# Patient Record
Sex: Male | Born: 2000 | Race: Black or African American | Hispanic: No | Marital: Single | Smoking: Never smoker
Health system: Southern US, Community
[De-identification: ages and names within clinical notes are randomized; demographics above are authoritative.]

## PROBLEM LIST (undated history)

## (undated) HISTORY — PX: HERNIA REPAIR: SHX51

---

## 2019-09-15 ENCOUNTER — Emergency Department (HOSPITAL_COMMUNITY): Payer: No Typology Code available for payment source

## 2019-09-15 ENCOUNTER — Other Ambulatory Visit: Payer: Self-pay

## 2019-09-15 ENCOUNTER — Encounter (HOSPITAL_COMMUNITY): Payer: Self-pay | Admitting: *Deleted

## 2019-09-15 ENCOUNTER — Emergency Department (HOSPITAL_COMMUNITY)
Admission: EM | Admit: 2019-09-15 | Discharge: 2019-09-15 | Disposition: A | Discharge status: Home / Self Care | Payer: No Typology Code available for payment source | Attending: Emergency Medicine | Admitting: Emergency Medicine

## 2019-09-15 DIAGNOSIS — G8929 Other chronic pain: Secondary | ICD-10-CM | POA: Diagnosis not present

## 2019-09-15 DIAGNOSIS — M25571 Pain in right ankle and joints of right foot: Secondary | ICD-10-CM | POA: Diagnosis present

## 2019-09-15 MED ORDER — NAPROXEN 500 MG PO TABS: 500.0000 mg | ORAL_TABLET | Freq: Two times a day (BID) | ORAL | 0 refills | Status: AC | PRN

## 2019-09-15 NOTE — Discharge Instructions (Signed)
Wear the ankle brace as prescribed and follow-up with ankle specialist.  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 This can sometimes cause chronic ankle problems.  Return to the ED with new or worsening symptoms.

## 2019-09-15 NOTE — ED Triage Notes (Signed)
Pt states he has been having pain in his rt ankle for a while. Getting worse. Has been to PT and was told it was tendonitis. ambulated to triage room

## 2019-09-15 NOTE — ED Notes (Signed)
Ortho called 

## 2019-09-15 NOTE — ED Provider Notes (Signed)
[facility]-EMERGENCY DEPT Provider Note   CSN: [id number] Arrival date & time: 09/15/19  1430     History Chief Complaint  Patient presents with  . Ankle Pain    Rt    Victor Pierce is a 18 y.o. male.  Patient here with right ankle pain which he states is been bothering him for quite some time.  He believes since about 2017.  He did not have any fall or trauma.  He has pain in his ankle which is worse when he stands for prolonged period of time.  He was seen by physical therapist who told him he had tendinitis but this was several months ago.  He does not take anything for it.  He is here because his mother wants him to have an x-ray.  Denies any other joint pain.  No fever or vomiting.  Pain is across his lateral right ankle and is not there when he is not standing on it.  Denies any fever, redness, swelling.  The history is provided by the patient.  Ankle Pain      History reviewed. No pertinent past medical history.  There are no problems to display for this patient.   Past Surgical History:  Procedure Laterality Date  . HERNIA REPAIR         No family history on file.  Social History   Tobacco Use  . Smoking status: Never Smoker  . Smokeless tobacco: Never Used  Substance Use Topics  . Alcohol use: Never  . Drug use: Never    Home Medications Prior to Admission medications   Not on File    Allergies    Patient has no known allergies.  Review of Systems   Review of Systems  Constitutional: Negative for activity change and appetite change.  HENT: Negative for congestion and rhinorrhea.   Respiratory: Negative for chest tightness.   Cardiovascular: Negative for chest pain.  Gastrointestinal: Negative for abdominal pain, nausea and vomiting.  Genitourinary: Negative for dysuria and hematuria.  Musculoskeletal: Positive for arthralgias and myalgias.  Skin: Negative for rash.  Neurological: Negative for dizziness,  weakness and headaches.    all other systems are negative except as noted in the HPI and PMH.   Physical Exam Updated Vital Signs BP 126/85 (BP Location: Left Arm)   Pulse 66   Temp 98.3 F (36.8 C) (Oral)   Resp 15   Ht 5\' 7"  (1.702 m)   Wt 65.8 kg   SpO2 98%   BMI 22.71 kg/m   Physical Exam Vitals and nursing note reviewed.  Constitutional:      General: He is not in acute distress.    Appearance: He is well-developed.  HENT:     Head: Normocephalic and atraumatic.     Mouth/Throat:     Pharynx: No oropharyngeal exudate.  Eyes:     Conjunctiva/sclera: Conjunctivae normal.     Pupils: Pupils are equal, round, and reactive to light.  Neck:     Comments: No meningismus. Cardiovascular:     Rate and Rhythm: Normal rate and regular rhythm.     Heart sounds: Normal heart sounds. No murmur.  Pulmonary:     Effort: Pulmonary effort is normal. No respiratory distress.     Breath sounds: Normal breath sounds.  Abdominal:     Palpations: Abdomen is soft.     Tenderness: There is no abdominal tenderness. There is no guarding or rebound.  Musculoskeletal:  General: Tenderness present. No swelling, deformity or signs of injury. Normal range of motion.     Cervical back: Normal range of motion and neck supple.     Comments: Right ankle appears normal to inspection.  There is tenderness on the lateral side without edema or erythema. Intact DP and PT pulse.  Achilles tendon intact.  No pain at base of fifth metatarsal, no proximal fibular pain   Skin:    General: Skin is warm.  Neurological:     Mental Status: He is alert and oriented to person, place, and time.     Cranial Nerves: No cranial nerve deficit.     Motor: No abnormal muscle tone.     Coordination: Coordination normal.     Comments: No ataxia on finger to nose bilaterally. No pronator drift. 5/5 strength throughout. CN 2-12 intact.Equal grip strength. Sensation intact.   Psychiatric:        Behavior: Behavior  normal.     ED Results / Procedures / Treatments   Labs (all labs ordered are listed, but only abnormal results are displayed) Labs Reviewed - No data to display  EKG None  Radiology DG Ankle Complete Right  Result Date: 09/15/2019 CLINICAL DATA:  18 year old male with right ankle pain. EXAM: RIGHT ANKLE - COMPLETE 3+ VIEW COMPARISON:  None. FINDINGS: There is no acute fracture or dislocation. The bones are well mineralized. No arthritic changes. The ankle mortise is intact. 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 CT may provide better evaluation. The soft tissues are unremarkable. IMPRESSION: 1. No acute fracture or dislocation. 2. Findings suggestive of talocalcaneal coalition. Electronically Signed   By: Anner Crete M.D.   On: 09/15/2019 16:28    Procedures Procedures (including critical care time)  Medications Ordered in ED Medications - No data to display  ED Course  I have reviewed the triage vital signs and the nursing notes.  Pertinent labs & imaging results that were available during my care of the patient were reviewed by me and considered in my medical decision making (see chart for details).    MDM Rules/Calculators/A&P                     Chronic ankle pain with request for x-ray. Ankle is neurovascularly intact.  Discussed that x-ray would likely be low yield but patient wishes to proceed.  X-ray shows no fracture or dislocation.  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  Patient will be given an ASO, anti-inflammatories, referral to ankle specialist for likely MRI and follow-up. Return precautions discussed. Final Clinical Impression(s) / ED Diagnoses Final diagnoses:  Chronic pain of right ankle    Rx / DC Orders ED Discharge Orders    None       Erol Flanagin, Annie Main, MD 09/15/19 1646

## 2019-10-07 DIAGNOSIS — M2141 Flat foot [pes planus] (acquired), right foot: Secondary | ICD-10-CM | POA: Diagnosis not present

## 2019-10-07 DIAGNOSIS — M25871 Other specified joint disorders, right ankle and foot: Secondary | ICD-10-CM | POA: Diagnosis not present

## 2019-11-04 DIAGNOSIS — Z13228 Encounter for screening for other metabolic disorders: Secondary | ICD-10-CM | POA: Diagnosis not present

## 2019-11-04 DIAGNOSIS — Z0000 Encounter for general adult medical examination without abnormal findings: Secondary | ICD-10-CM | POA: Diagnosis not present

## 2019-11-18 DIAGNOSIS — M2141 Flat foot [pes planus] (acquired), right foot: Secondary | ICD-10-CM | POA: Diagnosis not present

## 2019-12-16 DIAGNOSIS — M2141 Flat foot [pes planus] (acquired), right foot: Secondary | ICD-10-CM | POA: Diagnosis not present

## 2020-02-02 DIAGNOSIS — Q666 Other congenital valgus deformities of feet: Secondary | ICD-10-CM | POA: Diagnosis not present

## 2020-02-02 DIAGNOSIS — M25571 Pain in right ankle and joints of right foot: Secondary | ICD-10-CM | POA: Diagnosis not present

## 2020-02-02 DIAGNOSIS — M7670 Peroneal tendinitis, unspecified leg: Secondary | ICD-10-CM | POA: Diagnosis not present

## 2020-02-02 DIAGNOSIS — M216X2 Other acquired deformities of left foot: Secondary | ICD-10-CM | POA: Diagnosis not present

## 2020-02-02 DIAGNOSIS — R29898 Other symptoms and signs involving the musculoskeletal system: Secondary | ICD-10-CM | POA: Diagnosis not present

## 2020-02-02 DIAGNOSIS — M216X1 Other acquired deformities of right foot: Secondary | ICD-10-CM | POA: Diagnosis not present

## 2020-02-02 DIAGNOSIS — G8929 Other chronic pain: Secondary | ICD-10-CM | POA: Diagnosis not present

## 2020-02-09 DIAGNOSIS — Q666 Other congenital valgus deformities of feet: Secondary | ICD-10-CM | POA: Diagnosis not present

## 2020-02-09 DIAGNOSIS — M25571 Pain in right ankle and joints of right foot: Secondary | ICD-10-CM | POA: Diagnosis not present

## 2020-02-09 DIAGNOSIS — M216X2 Other acquired deformities of left foot: Secondary | ICD-10-CM | POA: Diagnosis not present

## 2020-02-09 DIAGNOSIS — R29898 Other symptoms and signs involving the musculoskeletal system: Secondary | ICD-10-CM | POA: Diagnosis not present

## 2020-02-09 DIAGNOSIS — M7670 Peroneal tendinitis, unspecified leg: Secondary | ICD-10-CM | POA: Diagnosis not present

## 2020-02-09 DIAGNOSIS — M216X1 Other acquired deformities of right foot: Secondary | ICD-10-CM | POA: Diagnosis not present

## 2020-02-09 DIAGNOSIS — G8929 Other chronic pain: Secondary | ICD-10-CM | POA: Diagnosis not present

## 2020-08-29 IMAGING — DX DG ANKLE COMPLETE 3+V*R*
3 series · 3 of 3 positions shown · non-contrast
Comparison: None.

CLINICAL DATA: 18-year-old male with right ankle pain.

EXAM:
RIGHT ANKLE - COMPLETE 3+ VIEW

[ankle ap]
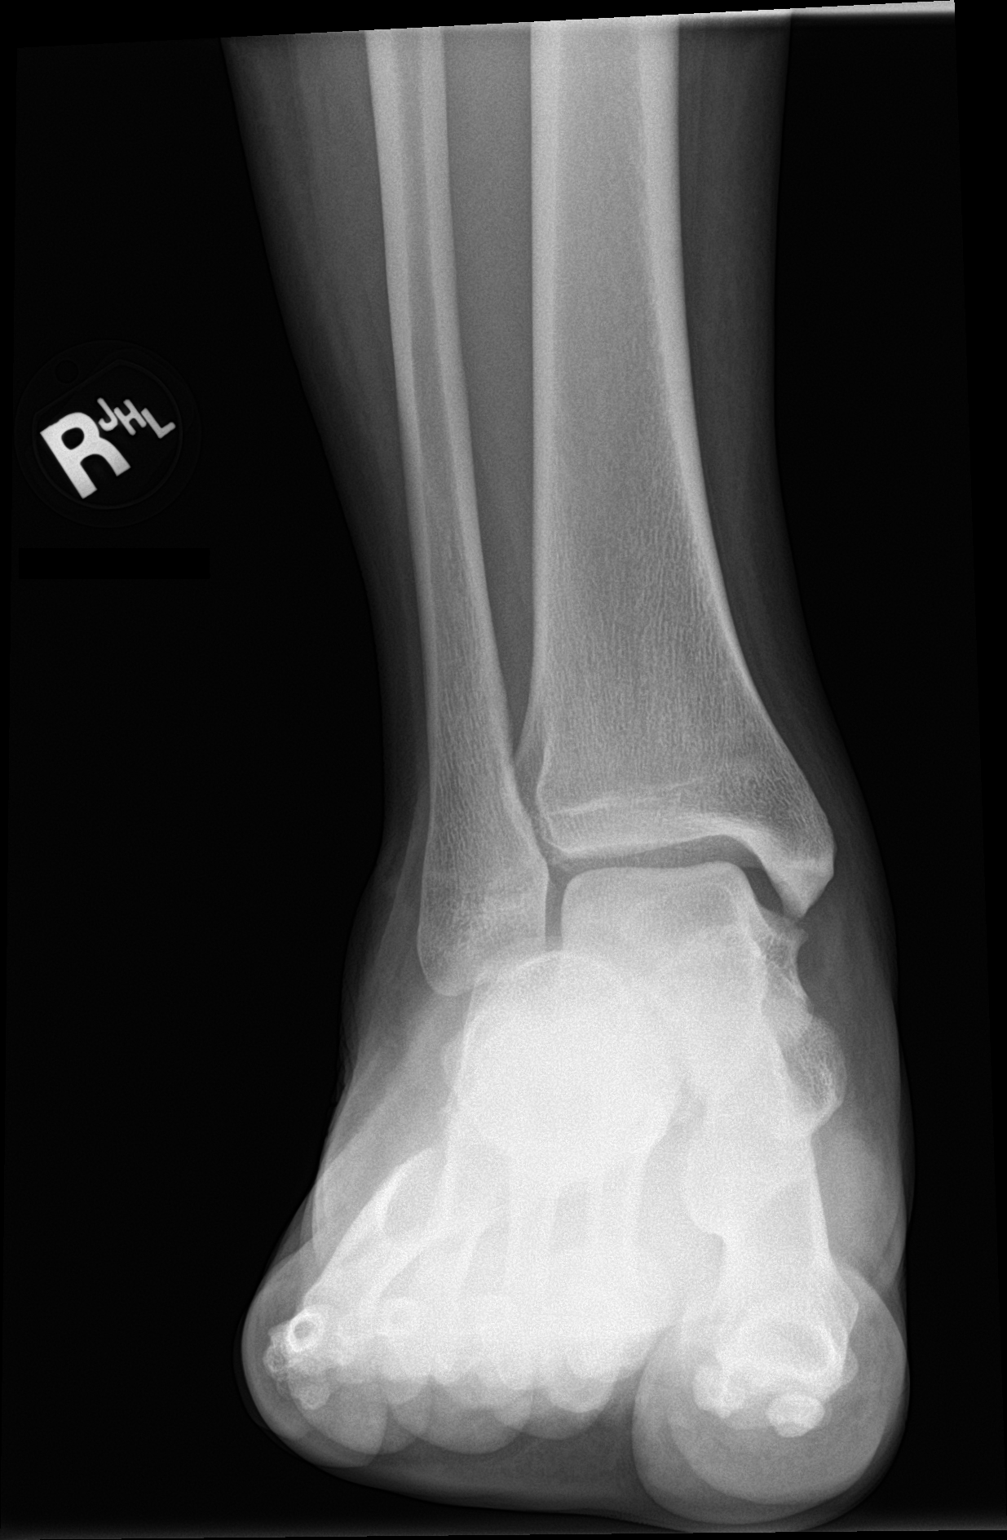

[ankle obl]
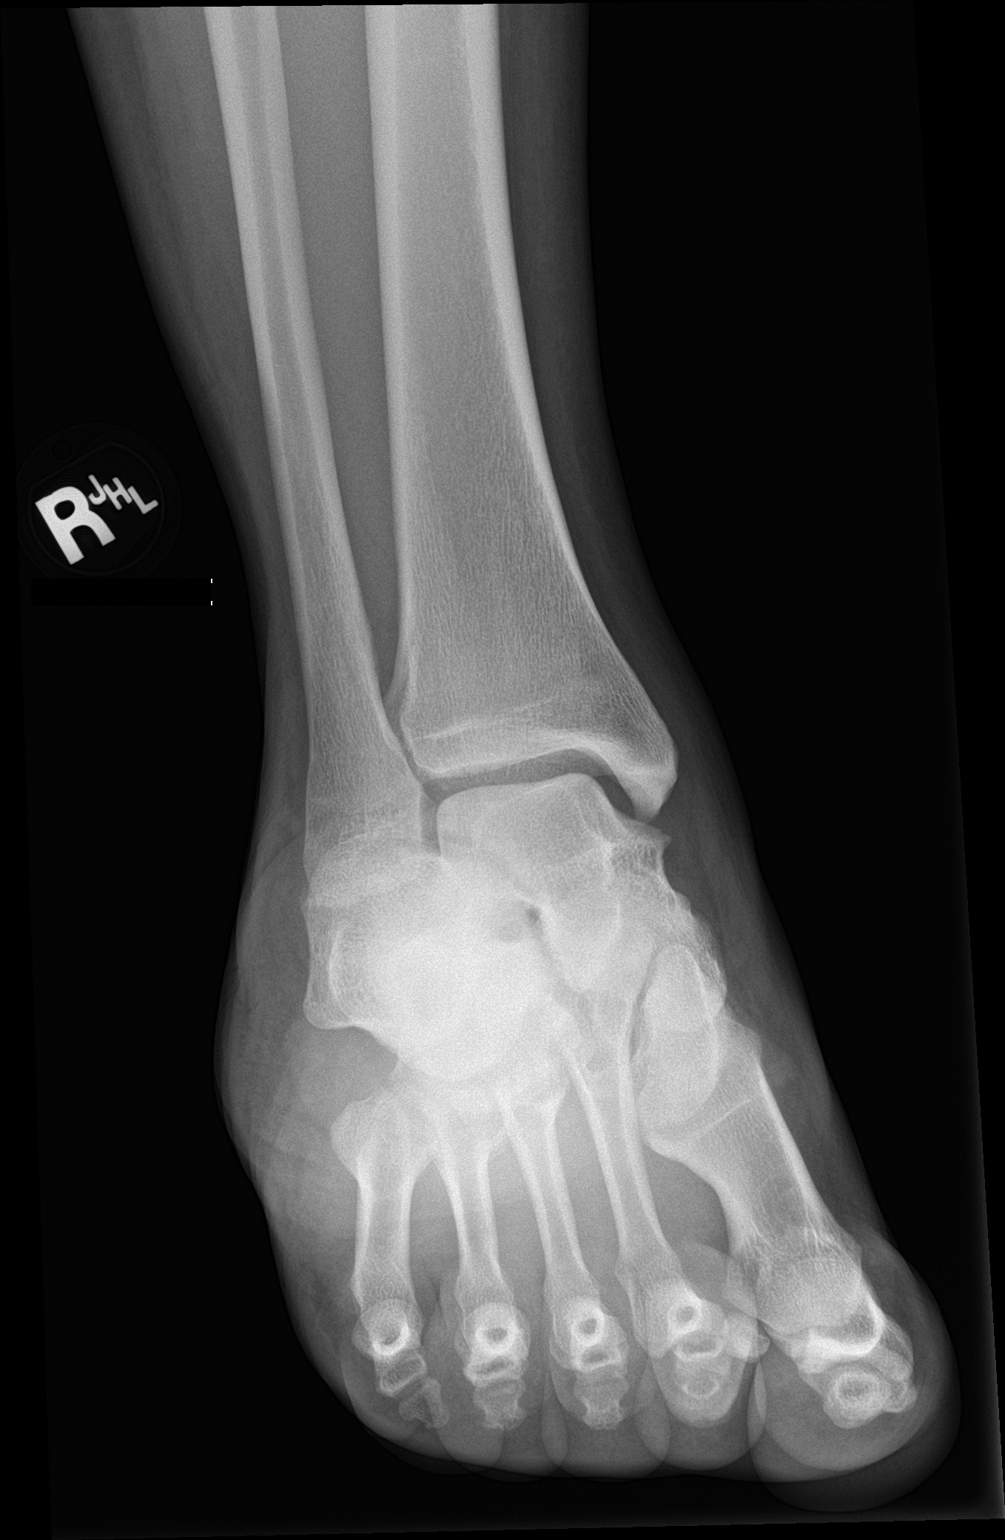

[ankle lat]
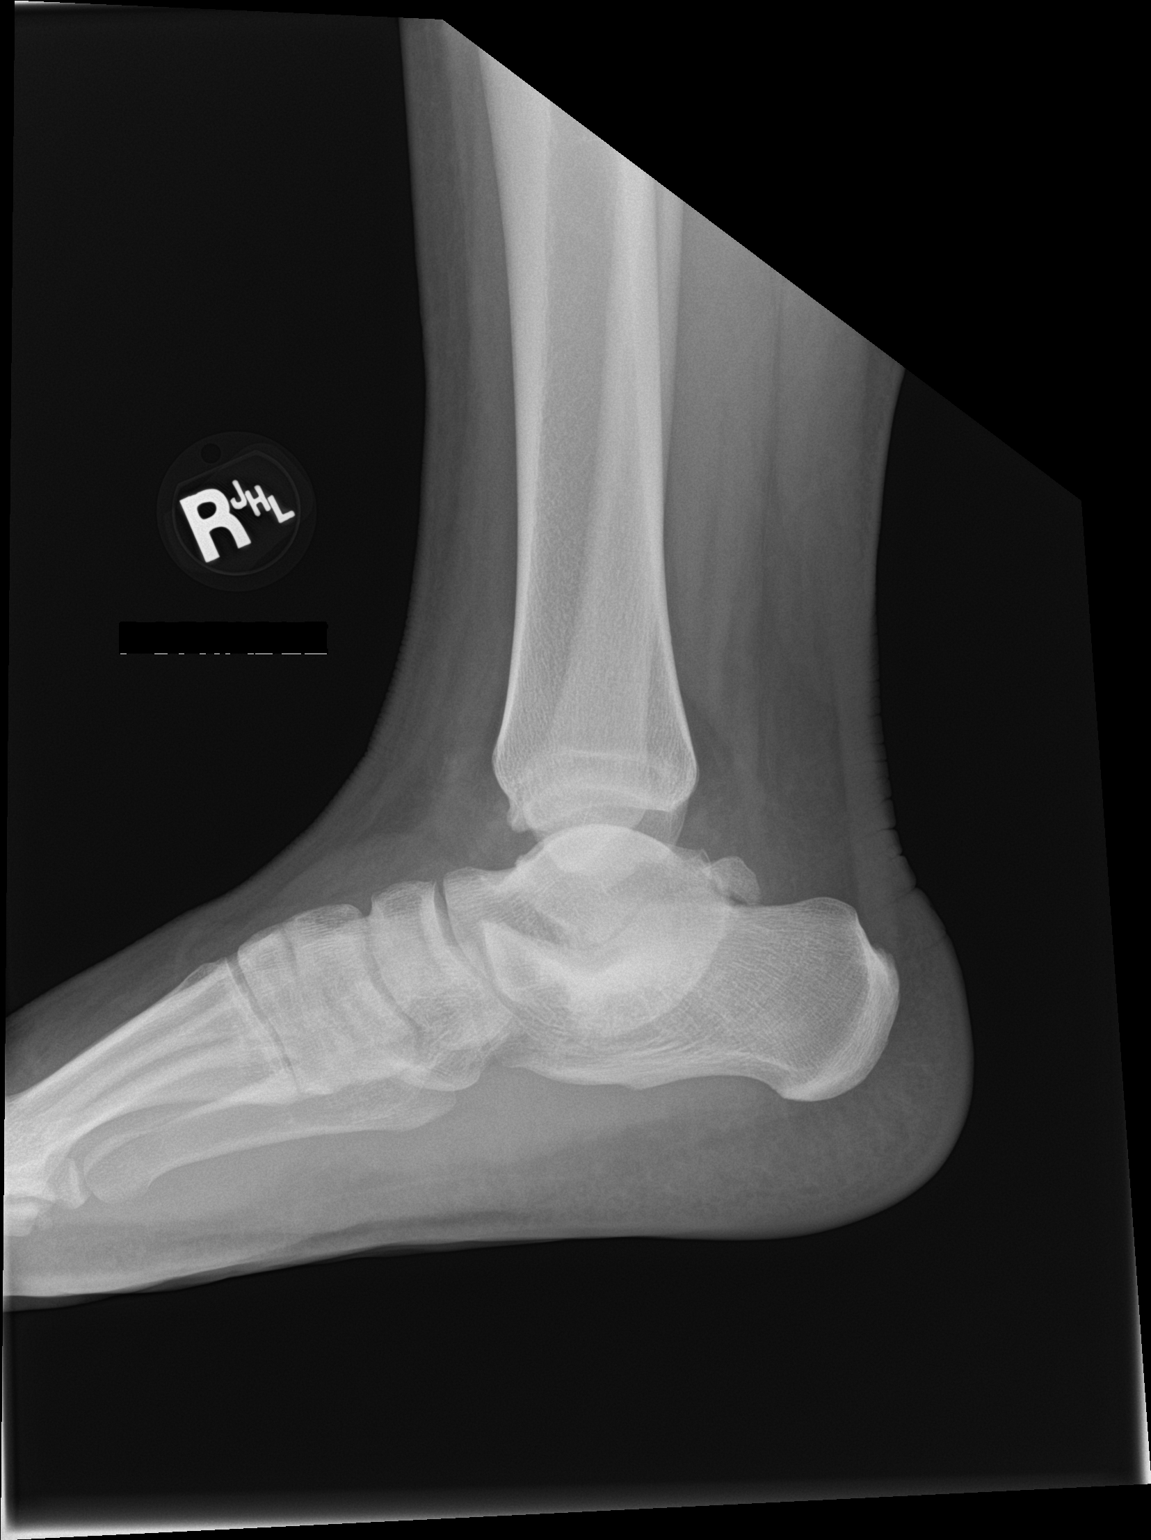

[3 of 3 positions shown; findings below may reference images not displayed]

FINDINGS: There is no acute fracture or dislocation. The bones are well
mineralized. No arthritic changes. The ankle mortise is intact.
There is findings suggestive of talocalcaneal coalition. CT may
provide better evaluation. The soft tissues are unremarkable.
IMPRESSION: 1. No acute fracture or dislocation.
2. Findings suggestive of talocalcaneal coalition.

## 2023-12-17 ENCOUNTER — Emergency Department (HOSPITAL_COMMUNITY)
Admission: EM | Admit: 2023-12-17 | Discharge: 2023-12-17 | Disposition: A | Discharge status: Home / Self Care | Attending: Emergency Medicine | Admitting: Emergency Medicine

## 2023-12-17 DIAGNOSIS — L0501 Pilonidal cyst with abscess: Secondary | ICD-10-CM | POA: Diagnosis not present

## 2023-12-17 DIAGNOSIS — R110 Nausea: Secondary | ICD-10-CM | POA: Diagnosis present

## 2023-12-17 MED ORDER — AMOXICILLIN-POT CLAVULANATE 875-125 MG PO TABS: 1.0000 | ORAL_TABLET | Freq: Two times a day (BID) | ORAL | 0 refills | Status: AC

## 2023-12-17 NOTE — ED Triage Notes (Signed)
 Pt states that since yesterday he has had a migraine, nausea, and chills. He also reports that he's had an abscess near his rectum for approximately one week, that "opened up" and started draining two days ago.

## 2023-12-17 NOTE — ED Provider Notes (Signed)
  [place] EMERGENCY DEPARTMENT AT [facility] Provider Note   CSN: [id number] Arrival date & time: 12/17/23  4098     History  Chief Complaint  Patient presents with   Nausea    Victor Pierce is a 23 y.o. male.  HPI Patient presents feeling bad.  Reportedly had some chills yesterday.  Has an abscess on his buttock.  States has had 1 here before. has had drainage.  Denies difficulty going to the bathroom.  Denies being diabetic.   No past medical history on file.  Home Medications Prior to Admission medications   Medication Sig Start Date End Date Taking? Authorizing Provider  amoxicillin-clavulanate (AUGMENTIN) 875-125 MG tablet Take 1 tablet by mouth every 12 (twelve) hours. 12/17/23  Yes Benjiman Core, MD  naproxen (NAPROSYN) 500 MG tablet Take 1 tablet (500 mg total) by mouth 2 (two) times daily as needed for moderate pain. 09/15/19   Glynn Octave, MD      Allergies    Patient has no known allergies.    Review of Systems   Review of Systems  Physical Exam Updated Vital Signs BP (!) 183/99 (BP Location: Right Arm)   Pulse (!) 102   Temp 98.2 F (36.8 C) (Oral)   Resp 15   SpO2 91%  Physical Exam Vitals and nursing note reviewed.  Cardiovascular:     Rate and Rhythm: Regular rhythm.  Abdominal:     Tenderness: There is no abdominal tenderness.  Skin:    Comments: Likely pilonidal abscess.  And natal cleft has a area with purulent drainage through 2 small holes.  Appears to be able to express all the pus out.  Mild induration around  Neurological:     Mental Status: He is alert.     ED Results / Procedures / Treatments   Labs (all labs ordered are listed, but only abnormal results are displayed) Labs Reviewed - No data to display  EKG None  Radiology No results found.  Procedures Procedures    Medications Ordered in ED Medications - No data to display  ED Course/ Medical Decision Making/ A&P                                  Medical Decision Making  Patient with draining abscess.  Likely pilonidal abscess.  Well-appearing.  Afebrile here.  With spontaneous drainage do not think we need incision and drainage at this time  Does not appear septic.  Appears stable for discharge home.  Can follow-up with general surgery as needed for potential surgical removal of the cyst.        Final Clinical Impression(s) / ED Diagnoses Final diagnoses:  Pilonidal abscess    Rx / DC Orders ED Discharge Orders          Ordered    amoxicillin-clavulanate (AUGMENTIN) 875-125 MG tablet  Every 12 hours        12/17/23 0926              Benjiman Core, MD 12/17/23 [phone]

## 2024-02-10 DIAGNOSIS — R051 Acute cough: Secondary | ICD-10-CM | POA: Diagnosis not present

## 2024-02-10 DIAGNOSIS — Z20822 Contact with and (suspected) exposure to covid-19: Secondary | ICD-10-CM | POA: Diagnosis not present

## 2024-02-10 DIAGNOSIS — J069 Acute upper respiratory infection, unspecified: Secondary | ICD-10-CM | POA: Diagnosis not present

## 2024-02-18 DIAGNOSIS — H66001 Acute suppurative otitis media without spontaneous rupture of ear drum, right ear: Secondary | ICD-10-CM | POA: Diagnosis not present

## 2024-02-18 DIAGNOSIS — R059 Cough, unspecified: Secondary | ICD-10-CM | POA: Diagnosis not present

## 2024-05-20 DIAGNOSIS — F419 Anxiety disorder, unspecified: Secondary | ICD-10-CM | POA: Diagnosis not present

## 2024-05-28 DIAGNOSIS — F419 Anxiety disorder, unspecified: Secondary | ICD-10-CM | POA: Diagnosis not present
